# Patient Record
Sex: Male | Born: 1995 | Race: White | Hispanic: No | Marital: Single | Smoking: Never smoker
Health system: Southern US, Community
[De-identification: ages and names within clinical notes are randomized; demographics above are authoritative.]

## PROBLEM LIST (undated history)

## (undated) HISTORY — PX: TYMPANOSTOMY TUBE PLACEMENT: SHX32

---
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
## 2018-02-02 ENCOUNTER — Emergency Department: Payer: Commercial Managed Care - HMO

## 2018-02-02 ENCOUNTER — Emergency Department
Admission: EM | Admit: 2018-02-02 | Discharge: 2018-02-03 | Disposition: A | Discharge status: Home / Self Care | Payer: Commercial Managed Care - HMO | Attending: Emergency Medicine | Admitting: Emergency Medicine

## 2018-02-02 ENCOUNTER — Encounter: Payer: Self-pay | Admitting: Emergency Medicine

## 2018-02-02 ENCOUNTER — Other Ambulatory Visit: Payer: Self-pay

## 2018-02-02 DIAGNOSIS — J069 Acute upper respiratory infection, unspecified: Secondary | ICD-10-CM | POA: Diagnosis not present

## 2018-02-02 DIAGNOSIS — J40 Bronchitis, not specified as acute or chronic: Secondary | ICD-10-CM | POA: Diagnosis not present

## 2018-02-02 DIAGNOSIS — R05 Cough: Secondary | ICD-10-CM | POA: Diagnosis present

## 2018-02-02 LAB — CBC
HEMATOCRIT: 42.9 % (ref 39.0–52.0)
Hemoglobin: 15 g/dL (ref 13.0–17.0)
MCH: 29.3 pg (ref 26.0–34.0)
MCHC: 35 g/dL (ref 30.0–36.0)
MCV: 83.8 fL (ref 80.0–100.0)
Platelets: 191 10*3/uL (ref 150–400)
RBC: 5.12 MIL/uL (ref 4.22–5.81)
RDW: 11.3 % — AB (ref 11.5–15.5)
WBC: 9.2 10*3/uL (ref 4.0–10.5)
nRBC: 0 % (ref 0.0–0.2)

## 2018-02-02 LAB — COMPREHENSIVE METABOLIC PANEL
ALBUMIN: 4.2 g/dL (ref 3.5–5.0)
ALT: 12 U/L (ref 0–44)
ANION GAP: 11 (ref 5–15)
AST: 18 U/L (ref 15–41)
Alkaline Phosphatase: 82 U/L (ref 38–126)
BILIRUBIN TOTAL: 1 mg/dL (ref 0.3–1.2)
BUN: 11 mg/dL (ref 6–20)
CHLORIDE: 102 mmol/L (ref 98–111)
CO2: 27 mmol/L (ref 22–32)
Calcium: 9.2 mg/dL (ref 8.9–10.3)
Creatinine, Ser: 1.06 mg/dL (ref 0.61–1.24)
GFR calc Af Amer: >60 mL/min (ref >60)
Glucose, Bld: 103 mg/dL — ABNORMAL HIGH (ref 70–99)
POTASSIUM: 3.6 mmol/L (ref 3.5–5.1)
Sodium: 140 mmol/L (ref 135–145)
Total Protein: 7.8 g/dL (ref 6.5–8.1)

## 2018-02-02 LAB — GROUP A STREP BY PCR: Group A Strep by PCR: NOT DETECTED

## 2018-02-02 LAB — MONONUCLEOSIS SCREEN: Mono Screen: NEGATIVE

## 2018-02-02 MED ORDER — MENTHOL 3 MG MT LOZG
1.0000 | LOZENGE | OROMUCOSAL | Status: DC | PRN
  Administered 2018-02-03: 3 mg via ORAL
  Filled 2018-02-02: qty 9

## 2018-02-02 NOTE — ED Triage Notes (Signed)
Pt presents to ED with productive cough, congestion, worsening sore throat since Sunday. vomiting Monday X2. Pt came to ED tonight due to "feeling really weird" with blurred vision and tremors after taking otc Mucinex around 1730. Pt alert and talkative and able to answer questions without difficulty. Skin warm and dry.

## 2018-02-03 NOTE — ED Provider Notes (Signed)
[facility] Emergency Department Provider Note   ____________________________________________   First MD Initiated Contact with Patient 02/02/18 2333     (approximate)  I have reviewed the triage vital signs and the nursing notes.   HISTORY  Chief Complaint Sore Throat    HPI Caleb Kennedy is a 22 y.o. male here for evaluation of cough and episode where he felt a little bit "off" a few hours ago   Patient is been having a cough vomited 2 times on Monday, now eating well and no abdominal pain but reports he is continued to have an ongoing occasional dry cough nasal congestion and slightly sore throat.  Reports after taking some cough medication that he was prescribed he started feeling a little bit weird, like his vision was blurred for an hour or so but that is now improved.  No headache.  No neck pain.  No ongoing nausea vomiting.  He is eating well.  History reviewed. No pertinent past medical history.  There are no active problems to display for this patient.   Past Surgical History:  Procedure Laterality Date  . TYMPANOSTOMY TUBE PLACEMENT      Prior to Admission medications   Not on File    Allergies Patient has no known allergies.  No family history on file.  Social History Social History   Tobacco Use  . Smoking status: Never Smoker  . Smokeless tobacco: Never Used  Substance Use Topics  . Alcohol use: Never    Frequency: Never  . Drug use: Never    Review of Systems Constitutional: No fever/chills feeling fatigue at times Eyes: No visual changes. ENT: No sore throat. Cardiovascular: Denies chest pain. Respiratory: See HPI.  No shortness of breath but having frequent dry cough gastrointestinal: No abdominal pain.   Genitourinary: Negative for dysuria. Musculoskeletal: Negative for back pain. Skin: Negative for rash. Neurological: Negative for headaches, areas of focal weakness or  numbness.    ____________________________________________   PHYSICAL EXAM:  VITAL SIGNS: ED Triage Vitals  Enc Vitals Group     BP 02/02/18 2207 (!) 140/95     Pulse Rate 02/02/18 2207 89     Resp 02/02/18 2207 20     Temp 02/02/18 2207 98.2 F (36.8 C)     Temp Source 02/02/18 2207 Oral     SpO2 02/02/18 2207 98 %     Weight 02/02/18 2213 130 lb (59 kg)     Height 02/02/18 2213 5\' 10"  (1.778 m)     Head Circumference --      Peak Flow --      Pain Score 02/02/18 2212 0     Pain Loc --      Pain Edu? --      Excl. in GC? --     Constitutional: Alert and oriented. Well appearing and in no acute distress.  Nontoxic.  Pleasant. Eyes: Conjunctivae are normal. Head: Atraumatic. Nose: No congestion/rhinnorhea. Mouth/Throat: Mucous membranes are moist.  Posterior oropharynx is slightly injected.  Normal tonsils without exudates.  No anterior cervical adenopathy. Neck: No stridor.  Cardiovascular: Normal rate, regular rhythm. Grossly normal heart sounds.  Good peripheral circulation.  Clear speech and language. Respiratory: Normal respiratory effort.  No retractions. Lungs CTAB. Gastrointestinal: Soft and nontender. No distention.  No pain to McBurney's point.  Denies abdominal pain in any quadrant.  No CVA tenderness. Musculoskeletal: No lower extremity tenderness nor edema. Neurologic:  Normal speech and language. No gross focal neurologic deficits  are appreciated.  No ataxia.  Normal smile and motor movements, 5 out of 5 strength all extremities. Skin:  Skin is warm, dry and intact. No rash noted. Psychiatric: Mood and affect are normal. Speech and behavior are normal.  ____________________________________________   LABS (all labs ordered are listed, but only abnormal results are displayed)  Labs Reviewed  CBC - Abnormal; Notable for the following components:      Result Value   RDW 11.3 (*)    All other components within normal limits  COMPREHENSIVE METABOLIC PANEL -  Abnormal; Notable for the following components:   Glucose, Bld 103 (*)    All other components within normal limits  GROUP A STREP BY PCR  MONONUCLEOSIS SCREEN   ____________________________________________  EKG   ____________________________________________  RADIOLOGY  Chest x-ray reviewed negative ____________________________________________   PROCEDURES  Procedure(s) performed: None  Procedures  Critical Care performed: No  ____________________________________________   INITIAL IMPRESSION / ASSESSMENT AND PLAN / ED COURSE  Pertinent labs & imaging results that were available during my care of the patient were reviewed by me and considered in my medical decision making (see chart for details).   Patient returns for evaluation of cough, congestion.  Afebrile nontoxic well-appearing.  Lab work strep and mono negative.  Reassuring labs.  Overall well-appearing, consistent with viral upper respiratory infection, low suspicion for bacterial process.  No signs or symptoms suggest meningitis, pneumonia, intra-abdominal infection, or other concerning etiology at this time.  Patient reports feeling of feeling a little bit weird was shortly after taking Mucinex DM, also Tessalon.  Counseled and advised to not take both together, and consider discontinue Mucinex DM.  Return precautions and treatment recommendations and follow-up discussed with the patient who is agreeable with the plan.       ____________________________________________   FINAL CLINICAL IMPRESSION(S) / ED DIAGNOSES  Final diagnoses:  Viral upper respiratory tract infection  Bronchitis        Note:  This document was prepared using Dragon voice recognition software and may include unintentional dictation errors       Sharyn Creamer, MD 02/03/18 0013

## 2018-06-02 ENCOUNTER — Other Ambulatory Visit: Payer: Self-pay

## 2018-06-02 ENCOUNTER — Emergency Department (HOSPITAL_COMMUNITY)
Admission: EM | Admit: 2018-06-02 | Discharge: 2018-06-03 | Disposition: A | Discharge status: Home / Self Care | Payer: 59 | Attending: Emergency Medicine | Admitting: Emergency Medicine

## 2018-06-02 ENCOUNTER — Emergency Department (HOSPITAL_COMMUNITY): Payer: 59

## 2018-06-02 ENCOUNTER — Encounter (HOSPITAL_COMMUNITY): Payer: Self-pay | Admitting: Emergency Medicine

## 2018-06-02 DIAGNOSIS — R079 Chest pain, unspecified: Secondary | ICD-10-CM | POA: Insufficient documentation

## 2018-06-02 DIAGNOSIS — R0789 Other chest pain: Secondary | ICD-10-CM | POA: Diagnosis present

## 2018-06-02 LAB — CBC
HEMATOCRIT: 44.1 % (ref 39.0–52.0)
HEMOGLOBIN: 14.6 g/dL (ref 13.0–17.0)
MCH: 29.8 pg (ref 26.0–34.0)
MCHC: 33.1 g/dL (ref 30.0–36.0)
MCV: 90 fL (ref 80.0–100.0)
NRBC: 0 % (ref 0.0–0.2)
Platelets: 174 10*3/uL (ref 150–400)
RBC: 4.9 MIL/uL (ref 4.22–5.81)
RDW: 11.8 % (ref 11.5–15.5)
WBC: 6.9 10*3/uL (ref 4.0–10.5)

## 2018-06-02 LAB — BASIC METABOLIC PANEL
ANION GAP: 7 (ref 5–15)
BUN: 11 mg/dL (ref 6–20)
CHLORIDE: 106 mmol/L (ref 98–111)
CO2: 27 mmol/L (ref 22–32)
Calcium: 9.1 mg/dL (ref 8.9–10.3)
Creatinine, Ser: 1.14 mg/dL (ref 0.61–1.24)
Glucose, Bld: 105 mg/dL — ABNORMAL HIGH (ref 70–99)
Potassium: 3.7 mmol/L (ref 3.5–5.1)
SODIUM: 140 mmol/L (ref 135–145)

## 2018-06-02 LAB — POCT I-STAT TROPONIN I: TROPONIN I, POC: 0 ng/mL (ref 0.00–0.08)

## 2018-06-02 MED ORDER — SODIUM CHLORIDE 0.9% FLUSH: 3.0000 mL | Freq: Once | INTRAVENOUS | Status: DC

## 2018-06-02 NOTE — ED Provider Notes (Signed)
ED ECG REPORT   Date: 06/02/2018  Rate: 79  Rhythm: normal sinus rhythm  QRS Axis: right  Intervals: normal  ST/T Wave abnormalities: nonspecific ST changes  Conduction Disutrbances:none  Narrative Interpretation:   Old EKG Reviewed: none available  I have personally reviewed the EKG tracing and agree with the computerized printout as noted.    Vanetta MuldersZackowski, Khoury Siemon, MD 06/02/18 [phone]

## 2018-06-02 NOTE — ED Triage Notes (Signed)
Pt states chest tightness began Thursday, said it came out of no where. No pain when breathing

## 2018-06-03 MED ORDER — RANITIDINE HCL 150 MG PO TABS: 150.0000 mg | ORAL_TABLET | Freq: Two times a day (BID) | ORAL | 0 refills | Status: DC

## 2018-06-03 NOTE — ED Notes (Signed)
PT DISCHARGED. INSTRUCTIONS AND PRESCRIPTION GIVEN. AAOX4. PT IN NO APPARENT DISTRESS OR PAIN. THE OPPORTUNITY TO ASK QUESTIONS WAS PROVIDED. 

## 2018-06-03 NOTE — Discharge Instructions (Signed)
1. Medications: Zantac, usual home medications 2. Treatment: rest, drink plenty of fluids,  3. Follow Up: Please followup with your primary doctor in 2-3 days for discussion of your diagnoses and further evaluation after today's visit; if you do not have a primary care doctor use the resource guide provided to find one; Please return to the ER for worsening pain, development of shortness of breath, pain that radiates to your jaw or arm, vomiting or any other concerns.

## 2018-06-03 NOTE — ED Provider Notes (Signed)
[facility]-EMERGENCY DEPT Provider Note   CSN: [id number] Arrival date & time: 06/02/18  2124     History   Chief Complaint Chief Complaint  Patient presents with  . Chest Pain    HPI Caleb Kennedy is a 23 y.o. male with a hx of no major medical problems presents to the Emergency Department complaining of gradual, persistent, left-sided chest pain onset 2-3 days ago.  Patient reports the pain has been constant without change.  No specific aggravating or alleviating factors.  Patient reports he started working out again approximately 2 weeks ago including lifting weights.  He reports he has been able to walk and exert himself in other ways without worsening of his chest pain over the last several days.  He has not specifically been to the gym in the last several days.  Pain is not worse with movement or coughing.  Patient denies URI symptoms including fever, chills, cough, congestion.  Patient is not a smoker.  No international travel, leg swelling or hormone usage.  He has no history of a DVT.  Pain is not pleuritic.  Patient reports that he has a long history of anxiety.  He reports he does not feel more stressed today than usual.     The history is provided by the patient and medical records. No language interpreter was used.    History reviewed. No pertinent past medical history.  There are no active problems to display for this patient.   Past Surgical History:  Procedure Laterality Date  . TYMPANOSTOMY TUBE PLACEMENT          Home Medications    Prior to Admission medications   Medication Sig Start Date End Date Taking? Authorizing Provider  ranitidine (ZANTAC) 150 MG tablet Take 1 tablet (150 mg total) by mouth 2 (two) times daily. 06/03/18   Jene Oravec, Dahlia Client, PA-C    Family History No family history on file.  Social History Social History   Tobacco Use  . Smoking status: Never Smoker  . Smokeless tobacco: Never Used  Substance Use  Topics  . Alcohol use: Never    Frequency: Never  . Drug use: Never     Allergies   Patient has no known allergies.   Review of Systems Review of Systems  Constitutional: Negative for appetite change, diaphoresis, fatigue, fever and unexpected weight change.  HENT: Negative for mouth sores.   Eyes: Negative for visual disturbance.  Respiratory: Negative for cough, chest tightness, shortness of breath and wheezing.   Cardiovascular: Positive for chest pain.  Gastrointestinal: Negative for abdominal pain, constipation, diarrhea, nausea and vomiting.  Endocrine: Negative for polydipsia, polyphagia and polyuria.  Genitourinary: Negative for dysuria, frequency, hematuria and urgency.  Musculoskeletal: Negative for back pain and neck stiffness.  Skin: Negative for rash.  Allergic/Immunologic: Negative for immunocompromised state.  Neurological: Negative for syncope, light-headedness and headaches.  Hematological: Does not bruise/bleed easily.  Psychiatric/Behavioral: Negative for sleep disturbance. The patient is not nervous/anxious.      Physical Exam Updated Vital Signs BP (!) 148/83 (BP Location: Right Arm)   Pulse 81   Temp 98.5 F (36.9 C) (Oral)   Resp 16   Ht 5\' 10"  (1.778 m)   Wt 63 kg   SpO2 99%   BMI 19.94 kg/m   Physical Exam Vitals signs and nursing note reviewed.  Constitutional:      General: He is not in acute distress.    Appearance: He is well-developed. He is not diaphoretic.  Comments: Awake, alert, nontoxic appearance  HENT:     Head: Normocephalic and atraumatic.     Mouth/Throat:     Pharynx: No oropharyngeal exudate.  Eyes:     General: No scleral icterus.    Conjunctiva/sclera: Conjunctivae normal.  Neck:     Musculoskeletal: Normal range of motion and neck supple.  Cardiovascular:     Rate and Rhythm: Normal rate and regular rhythm.     Heart sounds: Normal heart sounds.  Pulmonary:     Effort: Pulmonary effort is normal. No  respiratory distress.     Breath sounds: Normal breath sounds. No wheezing.  Chest:     Comments: No tenderness to palpation of the left chest. Abdominal:     General: Bowel sounds are normal.     Palpations: Abdomen is soft. There is no mass.     Tenderness: There is no abdominal tenderness. There is no guarding or rebound.  Musculoskeletal: Normal range of motion.  Skin:    General: Skin is warm and dry.  Neurological:     Mental Status: He is alert.     Comments: Speech is clear and goal oriented Moves extremities without ataxia      ED Treatments / Results  Labs (all labs ordered are listed, but only abnormal results are displayed) Labs Reviewed  BASIC METABOLIC PANEL - Abnormal; Notable for the following components:      Result Value   Glucose, Bld 105 (*)    All other components within normal limits  CBC  I-STAT TROPONIN, ED  POCT I-STAT TROPONIN I    ED ECG REPORT   Date: 06/03/2018  Rate: 79  Rhythm: normal sinus rhythm  QRS Axis: right  Intervals: normal  ST/T Wave abnormalities: nonspecific ST changes  Conduction Disutrbances:none  Narrative Interpretation:   Old EKG Reviewed: none available  I have personally reviewed the EKG tracing and agree with the computerized printout as noted.   Radiology Dg Chest 2 View  Result Date: 06/02/2018 CLINICAL DATA:  Chest pain EXAM: CHEST - 2 VIEW COMPARISON:  02/02/2018 FINDINGS: Heart and mediastinal contours are within normal limits. No focal opacities or effusions. No acute bony abnormality. IMPRESSION: No active cardiopulmonary disease. Electronically Signed   By: Charlett NoseKevin  Dover M.D.   On: 06/02/2018 22:34    Procedures Procedures (including critical care time)  Medications Ordered in ED Medications  sodium chloride flush (NS) 0.9 % injection 3 mL (has no administration in time range)     Initial Impression / Assessment and Plan / ED Course  I have reviewed the triage vital signs and the nursing  notes.  Pertinent labs & imaging results that were available during my care of the patient were reviewed by me and considered in my medical decision making (see chart for details).     Patient is to be discharged with recommendation to follow up with PCP in regards to today's hospital visit. Chest pain is not likely of cardiac or pulmonary etiology d/t presentation, PERC negative, VSS, no tracheal deviation, no JVD or new murmur, RRR, breath sounds equal bilaterally, EKG without evidence of STEMI, negative troponin, and negative CXR. Pt has been advised to return to the ED if CP becomes exertional, associated with diaphoresis or nausea, radiates to left jaw/arm, worsens or becomes concerning in any way.  Patient expresses concern for possible acid reflux.  Will give a trial of Zantac.  Pt appears reliable for follow up and is agreeable to discharge.   ECG reviewed  by Dr. Deretha Emory who agrees with the treatment plan.   Final Clinical Impressions(s) / ED Diagnoses   Final diagnoses:  Left-sided chest pain    ED Discharge Orders         Ordered    ranitidine (ZANTAC) 150 MG tablet  2 times daily     06/03/18 0007           Colleena Kurtenbach, Boyd Kerbs 06/03/18 0008    Tilden Fossa, MD 06/03/18 0025

## 2019-01-28 ENCOUNTER — Encounter: Payer: Self-pay | Admitting: Gastroenterology

## 2019-01-28 ENCOUNTER — Ambulatory Visit (INDEPENDENT_AMBULATORY_CARE_PROVIDER_SITE_OTHER): Payer: BC Managed Care – PPO | Admitting: Gastroenterology

## 2019-01-28 ENCOUNTER — Other Ambulatory Visit: Payer: Self-pay

## 2019-01-28 ENCOUNTER — Encounter (INDEPENDENT_AMBULATORY_CARE_PROVIDER_SITE_OTHER): Payer: Self-pay

## 2019-01-28 VITALS — BP 96/65 | HR 82 | Temp 98.1°F | Ht 70.0 in | Wt 136.8 lb

## 2019-01-28 DIAGNOSIS — R110 Nausea: Secondary | ICD-10-CM | POA: Diagnosis not present

## 2019-01-28 DIAGNOSIS — G8929 Other chronic pain: Secondary | ICD-10-CM | POA: Diagnosis not present

## 2019-01-28 DIAGNOSIS — R1013 Epigastric pain: Secondary | ICD-10-CM | POA: Diagnosis not present

## 2019-01-28 MED ORDER — PANTOPRAZOLE SODIUM 40 MG PO TBEC: 40.0000 mg | DELAYED_RELEASE_TABLET | Freq: Every day | ORAL | 0 refills | Status: DC

## 2019-01-28 MED ORDER — DEXILANT 60 MG PO CPDR: 60.0000 mg | DELAYED_RELEASE_CAPSULE | Freq: Every day | ORAL | 0 refills | Status: DC

## 2019-01-28 NOTE — Progress Notes (Signed)
Gastroenterology Consultation  Referring Provider:     Casilda Carls, MD Primary Care Physician:  Casilda Carls, MD Primary Gastroenterologist:  Dr. Allen Norris     Reason for Consultation:     Abdominal pain        HPI:   Caleb Kennedy is a 23 y.o. y/o male referred for consultation & management of abdominal pain by Dr. Casilda Carls, MD.  This patient comes in today with a history of abdominal pain.  The patient has reported the pain to be causing him to not be able to eat at times.  He was started on Nexium by his primary care provider. The patient reports that his abdominal pain is in the epigastric area.  The patient states the pain is off and on and usually better when he eats.  The patient also reports that he has nausea throughout the day but most of his nausea is early in the morning.  The patient was tried on Nexium and states he was taking 20 mg of Nexium in the morning and did not notice any relief with his symptoms.  The patient denies any overt heartburn or regurgitation.  The patient comes today with his mother who is also a patient of mine.  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  The patient's mother states that the patient has not been gaining weight although he states that he has always been skinny and eats well.  There is a report of some softer bowel movements but he only has 1 bowel movement per day.  History reviewed. No pertinent past medical history.  Past Surgical History:  Procedure Laterality Date  . TYMPANOSTOMY TUBE PLACEMENT      Prior to Admission medications   Medication Sig Start Date End Date Taking? Authorizing Provider  ranitidine (ZANTAC) 150 MG tablet Take 1 tablet (150 mg total) by mouth 2 (two) times daily. 06/03/18   Muthersbaugh, Jarrett Soho, PA-C    History reviewed. No pertinent family history.   Social History   Tobacco Use  . Smoking status: Never Smoker  . Smokeless tobacco: Never Used   Substance Use Topics  . Alcohol use: Never    Frequency: Never  . Drug use: Never    Allergies as of 01/28/2019  . (No Known Allergies)    Review of Systems:    All systems reviewed and negative except where noted in HPI.   Physical Exam:  BP 96/65   Pulse 82   Temp 98.1 F (36.7 C) (Temporal)   Ht 5\' 10"  (1.778 m)   Wt 136 lb 12.8 oz (62.1 kg)   BMI 19.63 kg/m  No LMP for male patient. General:   Alert,  Well-developed, well-nourished, pleasant and cooperative in NAD Head:  Normocephalic and atraumatic. Eyes:  Sclera clear, no icterus.   Conjunctiva pink. Ears:  Normal auditory acuity. Nose:  No deformity, discharge, or lesions. Mouth:  No deformity or lesions,oropharynx pink & moist. Neck:  Supple; no masses or thyromegaly. Lungs:  Respirations even and unlabored.  Clear throughout to auscultation.   No wheezes, crackles, or rhonchi. No acute distress. Heart:  Regular rate and rhythm; no murmurs, clicks, rubs, or gallops. Abdomen:  Normal bowel sounds.  No bruits.  Soft, non-tender and non-distended without masses, hepatosplenomegaly.  There was a small ventral hernia noted.  No guarding or rebound tenderness.  Negative Carnett sign.   Rectal:  Deferred.  Msk:  Symmetrical without gross deformities.  Good, equal movement & strength bilaterally. Pulses:  Normal pulses noted. Extremities:  No clubbing or edema.  No cyanosis. Neurologic:  Alert and oriented x3;  grossly normal neurologically. Skin:  Intact without significant lesions or rashes.  No jaundice. Lymph Nodes:  No significant cervical adenopathy. Psych:  Alert and cooperative. Normal mood and affect.  Imaging Studies: No results found.  Assessment and Plan:   Caleb Kennedy is a 23 y.o. y/o male who comes in today with a longstanding history of nausea mostly in the morning.  The patient has been told the pathophysiology of nausea and that since it happens in the morning more than any other times a day it may  be nocturnal reflux.  The patient will stop his Nexium and start Protonix right before he goes to bed so that he will have his maximum acid suppression while laying down.  If this does not help his symptoms the patient has been told that he should then be set up for a upper endoscopy.  The patient or his mother will contact us if he needs to be set up for an upper endoscopy.  The patient and his mother have been explained the plan and agree with it.  Midge Minium, MD. Clementeen Graham    Note: This dictation was prepared with Dragon dictation along with smaller phrase technology. Any transcriptional errors that result from this process are unintentional.

## 2019-03-07 ENCOUNTER — Other Ambulatory Visit: Payer: Self-pay

## 2019-03-07 ENCOUNTER — Other Ambulatory Visit: Payer: Self-pay | Admitting: Gastroenterology

## 2019-03-07 MED ORDER — PANTOPRAZOLE SODIUM 40 MG PO TBEC: 40.0000 mg | DELAYED_RELEASE_TABLET | Freq: Every day | ORAL | 1 refills | Status: DC

## 2019-03-07 NOTE — Telephone Encounter (Signed)
*  STAT* If patient is at the pharmacy, call can be transferred to refill team.   1. Which medications need to be refilled? (please list name of each medication and dose if known) pantoprazole (PROTONIX) 40 MG tablet  2. Which pharmacy/location (including street and city if local pharmacy) is medication to be sent to? [place]  3. Do they need a 30 day or 90 day supply? 90  day

## 2019-03-07 NOTE — Telephone Encounter (Signed)
Pt's mother notified Pantoprazole has been sent to pt's pharmacy.

## 2019-11-29 IMAGING — CR DG CHEST 2V
2 series · 2 of 2 positions shown · non-contrast
Comparison: None.

CLINICAL DATA: Productive cough and congestion with sore throat
since [REDACTED].

EXAM:
CHEST - 2 VIEW

[chest pa]
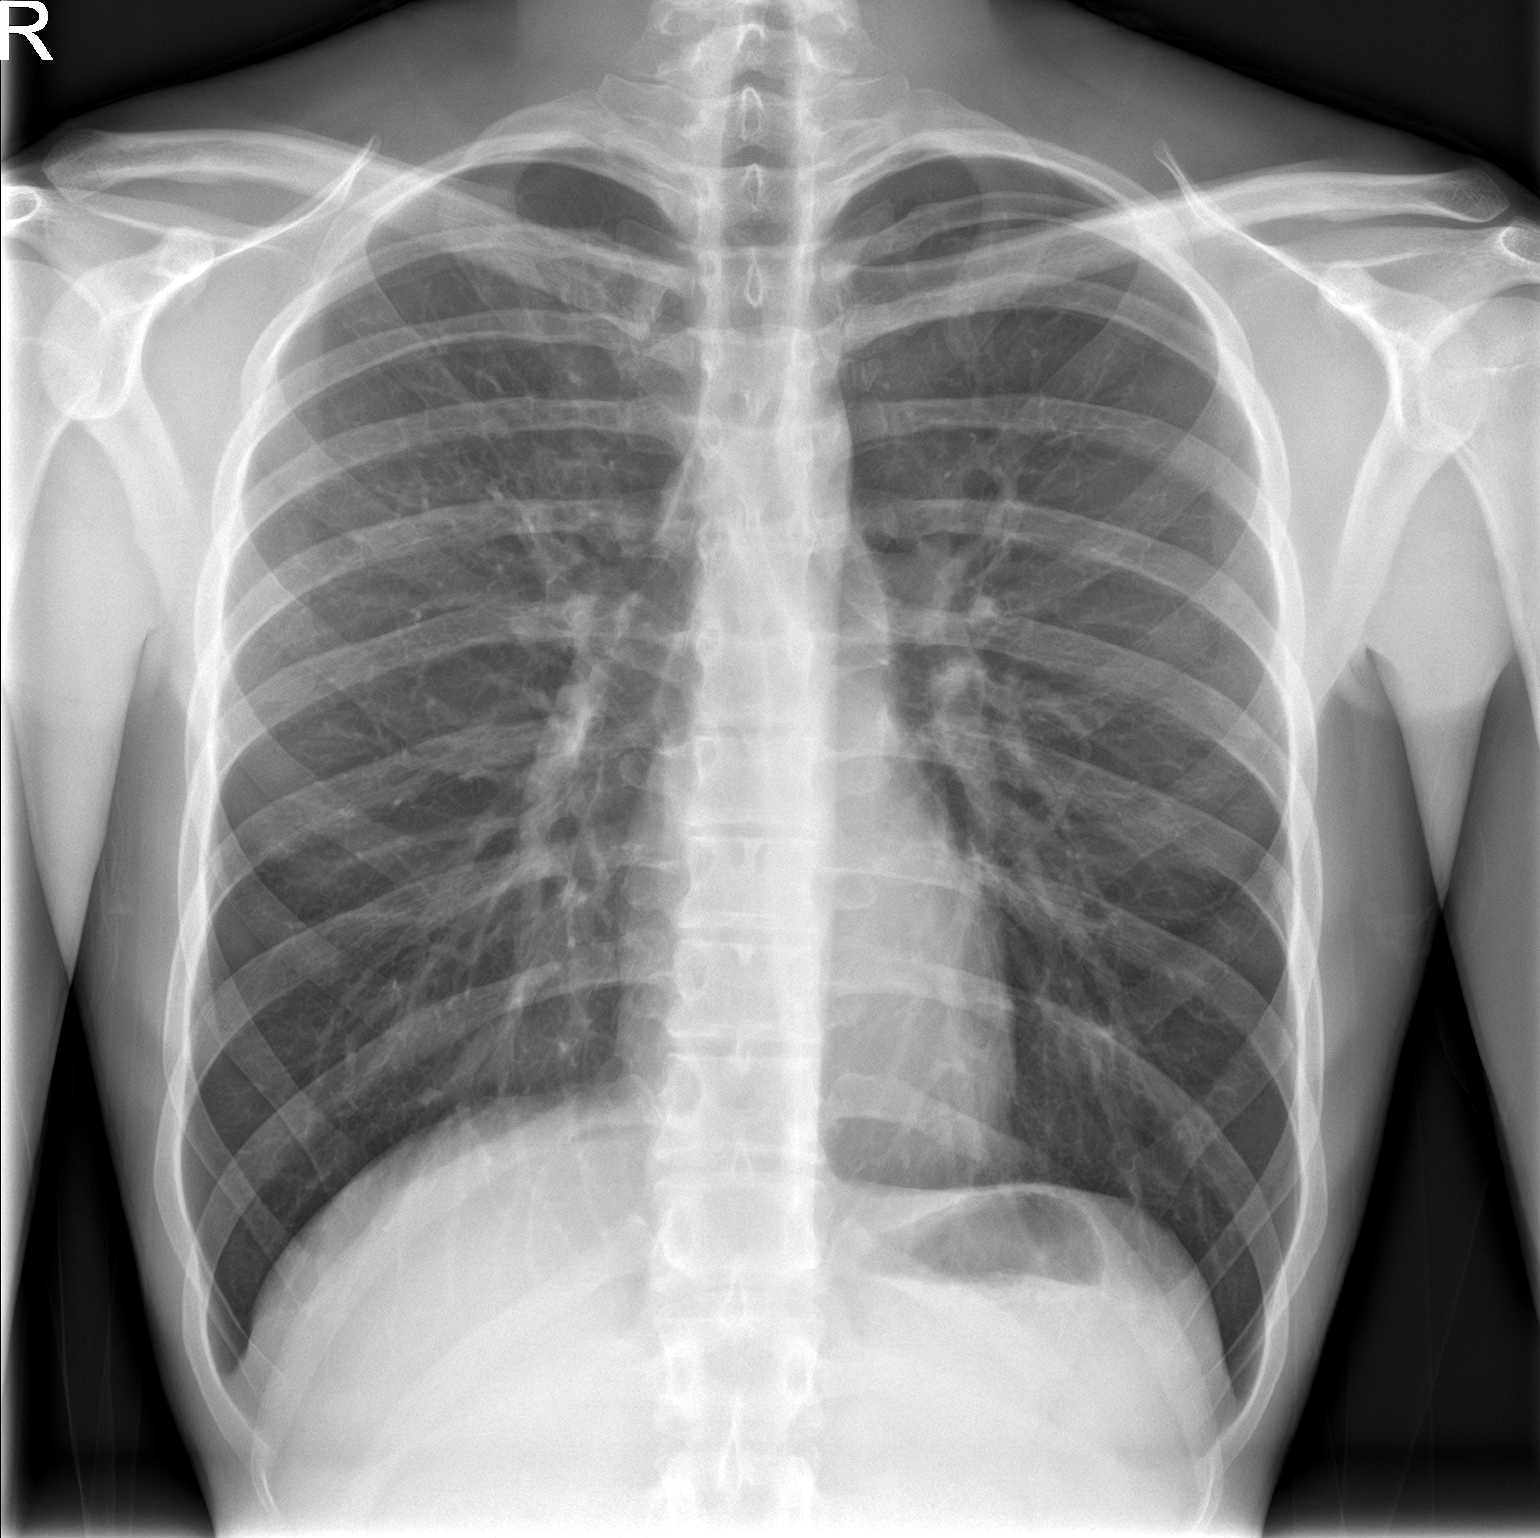

[chest lat]
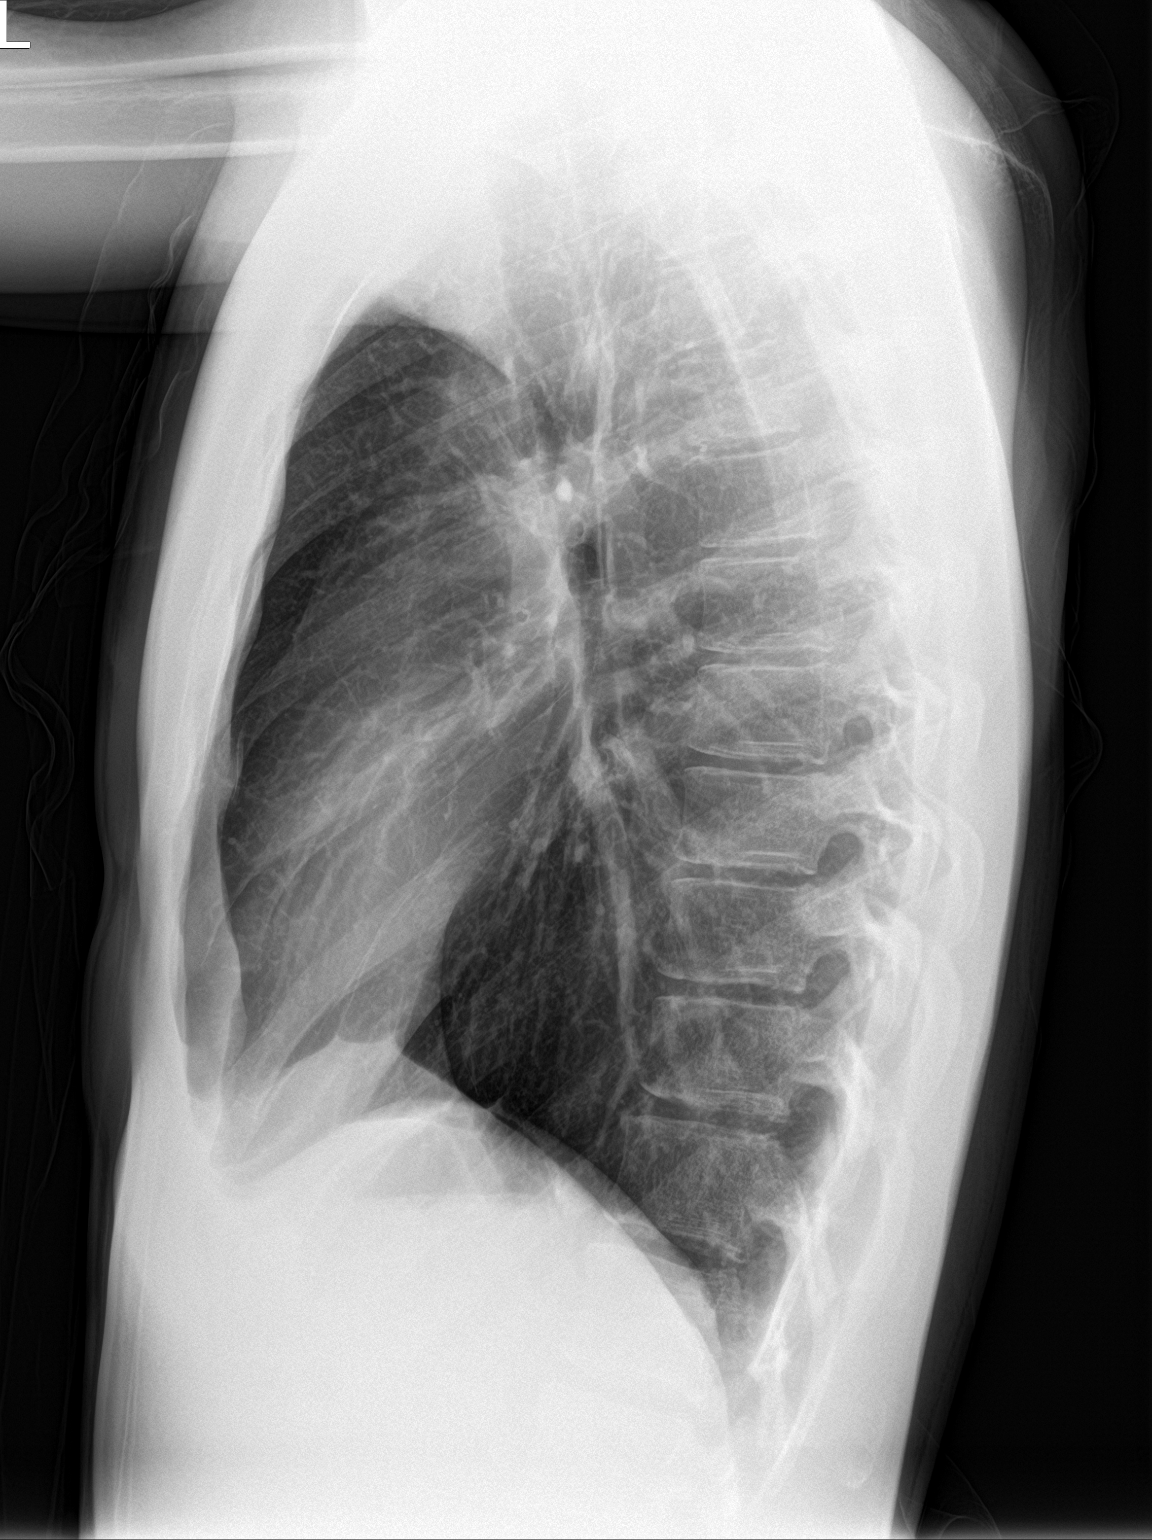

[2 of 2 positions shown; findings below may reference images not displayed]

FINDINGS: The heart size and mediastinal contours are within normal limits.
Both lungs are clear. The visualized skeletal structures are
unremarkable.
IMPRESSION: No active cardiopulmonary disease.

## 2020-04-27 ENCOUNTER — Ambulatory Visit
Admission: EM | Admit: 2020-04-27 | Discharge: 2020-04-27 | Disposition: A | Discharge status: Home / Self Care | Payer: BC Managed Care – PPO | Attending: Family Medicine | Admitting: Family Medicine

## 2020-04-27 DIAGNOSIS — U071 COVID-19: Secondary | ICD-10-CM | POA: Insufficient documentation

## 2020-04-27 LAB — POCT RAPID STREP A (OFFICE): Rapid Strep A Screen: NEGATIVE

## 2020-04-27 MED ORDER — PREDNISONE 20 MG PO TABS: 20.0000 mg | ORAL_TABLET | Freq: Every day | ORAL | 0 refills | Status: DC

## 2020-04-27 MED ORDER — LIDOCAINE VISCOUS HCL 2 % MT SOLN: 10.0000 mL | OROMUCOSAL | 0 refills | Status: DC | PRN

## 2020-04-27 NOTE — ED Triage Notes (Signed)
+  home covid test yesterday. Reports having sore throat x3-4 days. Painful when swallowing.

## 2020-04-27 NOTE — Discharge Instructions (Addendum)
Your COVID 19 results will be available in 3-5 days. Negative results are immediately resulted to Mychart. Positive results will receive a follow-up call from our clinic. If symptoms are present, I recommend home quarantine until results are known.   Take medication as prescribed. Rapid strep negative. Throat culture pending.

## 2020-04-27 NOTE — ED Provider Notes (Signed)
[facility]    CSN: [id number] Arrival date & time: 04/27/20  1450      History   Chief Complaint Chief Complaint  Patient presents with  . Sore Throat    HPI Caleb Kennedy is a 25 y.o. male.   HPI  Patient here today for confirmatory COVID -19 test after having two home test with one resulting as positive x 1 day.  Concern for possible strep, requesting a rapid strep test.  Afebrile.   No past medical history on file.  There are no problems to display for this patient.   Past Surgical History:  Procedure Laterality Date  . TYMPANOSTOMY TUBE PLACEMENT         Home Medications    Prior to Admission medications   Medication Sig Start Date End Date Taking? Authorizing Provider  dexlansoprazole (DEXILANT) 60 MG capsule Take 1 capsule (60 mg total) by mouth daily. 01/28/19   Midge Minium, MD  pantoprazole (PROTONIX) 40 MG tablet Take 1 tablet (40 mg total) by mouth daily. 03/07/19   Midge Minium, MD    Family History No family history on file.  Social History Social History   Tobacco Use  . Smoking status: Never Smoker  . Smokeless tobacco: Never Used  Vaping Use  . Vaping Use: Never used  Substance Use Topics  . Alcohol use: Never  . Drug use: Never     Allergies   Patient has no known allergies.   Review of Systems Review of Systems Pertinent negatives listed in HPI   Physical Exam Triage Vital Signs ED Triage Vitals [04/27/20 1530]  Enc Vitals Group     BP 128/86     Pulse Rate (!) 112     Resp 16     Temp 98 F (36.7 C)     Temp src      SpO2 98 %     Weight      Height      Head Circumference      Peak Flow      Pain Score      Pain Loc      Pain Edu?      Excl. in GC?    No data found.  Updated Vital Signs BP 128/86   Pulse (!) 112   Temp 98 F (36.7 C)   Resp 16   SpO2 98%   Visual Acuity Right Eye Distance:   Left Eye Distance:   Bilateral Distance:    Right Eye Near:   Left Eye Near:    Bilateral  Near:     Physical Exam  General Appearance:    Alert, cooperative, ill-appearing, no distress  HENT:   Normocephalic, ears normal, nares mucosal edema with congestion, rhinorrhea, oropharynx erythematous with tonsillar swelling   Eyes:    PERRL, conjunctiva/corneas clear, EOM's intact       Lungs:     Clear to auscultation bilaterally, respirations unlabored  Heart:    Regular rate and rhythm  Neurologic:   Awake, alert, oriented x 3. No apparent focal neurological           defect.      UC Treatments / Results  Labs (all labs ordered are listed, but only abnormal results are displayed) Labs Reviewed - No data to display  EKG   Radiology No results found.  Procedures Procedures (including critical care time)  Medications Ordered in UC Medications - No data to display  Initial Impression / Assessment and  Plan / UC Course  I have reviewed the triage vital signs and the nursing notes.  Pertinent labs & imaging results that were available during my care of the patient were reviewed by me and considered in my medical decision making (see chart for details).    Rapid strep negative. Covid home test positive, patient advised of positive COVID infection. PCR pending to meet school requirements. Throat culture pending. Treatment per discharge medications. Red flag precautions given. Final Clinical Impressions(s) / UC Diagnoses   Final diagnoses:  None   Discharge Instructions   None    ED Prescriptions    None     PDMP not reviewed this encounter.   Bing Neighbors, [place] 04/27/20 2108

## 2020-04-29 LAB — CULTURE, GROUP A STREP (THRC)

## 2020-04-30 LAB — COVID-19, FLU A+B NAA
Influenza A, NAA: NOT DETECTED
Influenza B, NAA: NOT DETECTED
SARS-CoV-2, NAA: DETECTED — AB

## 2020-04-30 LAB — CULTURE, GROUP A STREP (THRC)

## 2021-06-22 ENCOUNTER — Encounter: Payer: Self-pay | Admitting: Emergency Medicine

## 2021-06-22 ENCOUNTER — Other Ambulatory Visit: Payer: Self-pay

## 2021-06-22 ENCOUNTER — Emergency Department
Admission: EM | Admit: 2021-06-22 | Discharge: 2021-06-22 | Disposition: A | Discharge status: Home / Self Care | Payer: BC Managed Care – PPO | Attending: Emergency Medicine | Admitting: Emergency Medicine

## 2021-06-22 ENCOUNTER — Emergency Department: Payer: BC Managed Care – PPO

## 2021-06-22 DIAGNOSIS — F419 Anxiety disorder, unspecified: Secondary | ICD-10-CM | POA: Diagnosis not present

## 2021-06-22 DIAGNOSIS — R1310 Dysphagia, unspecified: Secondary | ICD-10-CM | POA: Insufficient documentation

## 2021-06-22 DIAGNOSIS — R002 Palpitations: Secondary | ICD-10-CM | POA: Diagnosis not present

## 2021-06-22 DIAGNOSIS — R0789 Other chest pain: Secondary | ICD-10-CM | POA: Insufficient documentation

## 2021-06-22 DIAGNOSIS — R110 Nausea: Secondary | ICD-10-CM | POA: Insufficient documentation

## 2021-06-22 DIAGNOSIS — R000 Tachycardia, unspecified: Secondary | ICD-10-CM | POA: Insufficient documentation

## 2021-06-22 LAB — CBC
HCT: 42.3 % (ref 39.0–52.0)
Hemoglobin: 14.7 g/dL (ref 13.0–17.0)
MCH: 29.4 pg (ref 26.0–34.0)
MCHC: 34.8 g/dL (ref 30.0–36.0)
MCV: 84.6 fL (ref 80.0–100.0)
Platelets: 198 10*3/uL (ref 150–400)
RBC: 5 MIL/uL (ref 4.22–5.81)
RDW: 11.9 % (ref 11.5–15.5)
WBC: 7.1 10*3/uL (ref 4.0–10.5)
nRBC: 0 % (ref 0.0–0.2)

## 2021-06-22 LAB — BASIC METABOLIC PANEL
Anion gap: 10 (ref 5–15)
BUN: 13 mg/dL (ref 6–20)
CO2: 27 mmol/L (ref 22–32)
Calcium: 9.5 mg/dL (ref 8.9–10.3)
Chloride: 104 mmol/L (ref 98–111)
Creatinine, Ser: 1.16 mg/dL (ref 0.61–1.24)
GFR, Estimated: >60 mL/min (ref >60)
Glucose, Bld: 85 mg/dL (ref 70–99)
Potassium: 3.2 mmol/L — ABNORMAL LOW (ref 3.5–5.1)
Sodium: 141 mmol/L (ref 135–145)

## 2021-06-22 LAB — TROPONIN I (HIGH SENSITIVITY)
Troponin I (High Sensitivity): <2 ng/L (ref <18)
Troponin I (High Sensitivity): <2 ng/L (ref <18)

## 2021-06-22 MED ORDER — ONDANSETRON HCL 8 MG PO TABS: 8.0000 mg | ORAL_TABLET | Freq: Three times a day (TID) | ORAL | 0 refills | Status: DC | PRN

## 2021-06-22 MED ORDER — PANTOPRAZOLE SODIUM 40 MG PO TBEC: 40.0000 mg | DELAYED_RELEASE_TABLET | Freq: Every day | ORAL | 0 refills | Status: DC

## 2021-06-22 NOTE — Discharge Instructions (Addendum)
The tests today were reassuring, with a normal EKG, negative cardiac enzymes, and normal blood chemistry.  Take the Protonix as prescribed.  You may take the Zofran as needed for nausea.  Follow-up with your primary care physician.  We have also provided referral to a cardiologist; if you continue to have intermittent palpitations you will need to follow-up with cardiology for possible Holter monitoring or other outpatient work-up.  If the nausea and difficulty with eating continue you also will likely need gastroenterology follow-up.  You can discuss this with your primary care doctor.  Return to the ER immediately for new, worsening, or persistent severe palpitations, chest pain, difficulty breathing, nausea or vomiting, inability to swallow, or any other new or worsening symptoms that concern you.

## 2021-06-22 NOTE — ED Provider Notes (Signed)
[facility] Provider Note    Event Date/Time   First MD Initiated Contact with Patient 06/22/21 2100     (approximate)   History   Palpitations   HPI  Caleb Kennedy is a 26 y.o. male with no active medical problems who presents with intermittent palpitations over the last few months which he describes as a flutter sensation and which is usually quite brief, generally lasting a few seconds.  Sometimes he then starts to become anxious, starts to have tightness in his chest and shortness of breath, and this may last slightly longer.  The patient also reports intermittent nausea especially after eating, and sometimes he feels like he has difficulty swallowing.  This is also been going on for some time.  Initially the fluttering sensation was associated with this.  However, now the nausea has improved somewhat but he is still having the fluttering.  He denies any chest pain or pressure.  He has no leg swelling.  He denies vomiting or diarrhea.     Physical Exam   Triage Vital Signs: ED Triage Vitals  Enc Vitals Group     BP 06/22/21 1843 121/65     Pulse Rate 06/22/21 1843 74     Resp 06/22/21 1843 17     Temp 06/22/21 1843 97.8 F (36.6 C)     Temp Source 06/22/21 1843 Oral     SpO2 06/22/21 1843 100 %     Weight 06/22/21 1836 133 lb (60.3 kg)     Height 06/22/21 1836 6' (1.829 m)     Head Circumference --      Peak Flow --      Pain Score 06/22/21 1835 0     Pain Loc --      Pain Edu? --      Excl. in GC? --     Most recent vital signs: Vitals:   06/22/21 2049 06/22/21 2305  BP: 136/73 (!) 105/59  Pulse: 78 70  Resp: 18 16  Temp:  97.8 F (36.6 C)  SpO2: 100% 98%    General: Awake, no distress.  CV:  Good peripheral perfusion.  Normal heart sounds. Resp:  Normal effort.  Lungs CTAB. Abd:  No distention.  Other:  Motor and sensory intact in all extremities.  Normal coordination.  No ataxia.   ED Results / Procedures / Treatments    Labs (all labs ordered are listed, but only abnormal results are displayed) Labs Reviewed  BASIC METABOLIC PANEL - Abnormal; Notable for the following components:      Result Value   Potassium 3.2 (*)    All other components within normal limits  CBC  TROPONIN I (HIGH SENSITIVITY)  TROPONIN I (HIGH SENSITIVITY)     EKG  ED ECG REPORT I, Dionne Bucy, the attending physician, personally viewed and interpreted this ECG.  Date: 06/23/2021 EKG Time: 1838 Rate: 88 Rhythm: normal sinus rhythm QRS Axis: normal Intervals: normal ST/T Wave abnormalities: Nonspecific T wave abnormalities laterally Narrative Interpretation: Nonspecific abnormalities with no evidence of acute ischemia    RADIOLOGY  Chest x-ray: I independently viewed and interpreted the images; there is no focal consolidation or edema  PROCEDURES:  Critical Care performed: No  Procedures   MEDICATIONS ORDERED IN ED: Medications - No data to display   IMPRESSION / MDM / ASSESSMENT AND PLAN / ED COURSE  I reviewed the triage vital signs and the nursing notes.  26 year old male with no active medical problems presents  with an acute on chronic episode of very brief palpitations which are often associated with anxiety and what sounds like a prevent elation.  He also has some chronic episodes of nausea with eating, but this is not active today.  On exam the patient is well-appearing.  His vital signs are normal.  The physical exam is otherwise unremarkable.  Chest x-ray shows no acute abnormalities.  EKG  is nonischemic and does not show evidence of arrhythmia.  Basic labs and initial troponin are normal.  Overall presentation is consistent with PVCs, sinus tachycardia, anxiety, or other benign etiology.  The patient has some nausea and occasionally reports difficulty eating but this does not seem to be true dysphagia from his description of it.  This may also be anxiety mediated.  Differential also  includes GERD.  We will obtain a second troponin, however based on the negative work-up and well appearance, there is no indication for further ED work-up or monitoring.  I recommend that the patient's start on omeprazole and have also prescribe Zofran if needed for the nausea, as this seems to often be triggering the palpitations.  I have provided cardiology referral as the patient likely would benefit from outpatient Holter monitoring.  He may also need GI follow-up if the symptoms do not resolve on their own.  I counseled the patient and mother extensively on the results of the work-up, likely etiologies of his symptoms, and plan of care.  I gave them thorough return precautions and they expressed understanding.   FINAL CLINICAL IMPRESSION(S) / ED DIAGNOSES   Final diagnoses:  Palpitations     Rx / DC Orders   ED Discharge Orders          Ordered    pantoprazole (PROTONIX) 40 MG tablet  Daily        06/22/21 2301    ondansetron (ZOFRAN) 8 MG tablet  Every 8 hours PRN        06/22/21 2301             Note:  This document was prepared using Dragon voice recognition software and may include unintentional dictation errors.    Dionne Bucy, MD 06/23/21 [phone]

## 2021-06-22 NOTE — ED Triage Notes (Signed)
Pt via POV from home. Pt c/o chest palpitations and SOB. Pt states that it has been going on for a month but got worse in the past couple of day. Also endorses nausea. Pt is A&OX4 and NAD.

## 2021-07-30 ENCOUNTER — Ambulatory Visit: Payer: BC Managed Care – PPO | Admitting: Cardiology

## 2022-04-07 ENCOUNTER — Ambulatory Visit
Admission: EM | Admit: 2022-04-07 | Discharge: 2022-04-07 | Disposition: A | Discharge status: Home / Self Care | Payer: BC Managed Care – PPO | Attending: Urgent Care | Admitting: Urgent Care

## 2022-04-07 DIAGNOSIS — R509 Fever, unspecified: Secondary | ICD-10-CM

## 2022-04-07 DIAGNOSIS — R058 Other specified cough: Secondary | ICD-10-CM | POA: Insufficient documentation

## 2022-04-07 DIAGNOSIS — Z1152 Encounter for screening for COVID-19: Secondary | ICD-10-CM | POA: Insufficient documentation

## 2022-04-07 DIAGNOSIS — R6889 Other general symptoms and signs: Secondary | ICD-10-CM | POA: Insufficient documentation

## 2022-04-07 MED ORDER — BENZONATATE 100 MG PO CAPS: ORAL_CAPSULE | ORAL | 0 refills | Status: DC

## 2022-04-07 NOTE — Discharge Instructions (Signed)
You have been diagnosed with a viral upper respiratory infection based on your symptoms and exam. Viral illnesses cannot be treated with antibiotics - they are self limiting - and you should find your symptoms resolving within a few days. Get plenty of rest and non-caffeinated fluids.  We have performed a respiratory swab checking for COVID, and influenza.   We recommend you use over-the-counter medications for symptom control including Tylenol or ibuprofen for fever, chills or body aches, and  cold/cough medication.  Saline mist spray is helpful for removing excess mucus from your nose.  Room humidifiers are helpful to ease breathing at night. You might also find relief of nasal/sinus congestion symptoms by using a nasal decongestant such as Sudafed sinus (pseudoephedrine).  You will need to obtain this medication from behind the pharmacist counter.  Speak to the pharmacist to verify that you are not duplicating medications with other over-the-counter formulations that you may be using.   Follow up here or with your primary care provider if your symptoms are worsening or not improving.

## 2022-04-07 NOTE — ED Provider Notes (Signed)
Renal[facility] F[facility]ler    CSN: [id number] Arrival date & time: 04/07/22  1800      History   Chief Complaint Chief Complaint  Patient presents with   Fever   Cough    HPI TAGGART PRASAD is a 26 y.o. male.    Fever Associated symptoms: cough   Cough Associated symptoms: fever     Patient presents to urgent care with fever (100.3 F) starting 3 days ago.  He endorses headache, body aches, nausea, diarrhea, fatigue, decreased appetite.  He has dizziness that he believes is related to medication use.  He states his lungs "stinging" when he coughs.  History reviewed. No pertinent past medical history.  There are no problems to display for this patient.   Past Surgical History:  Procedure Laterality Date   TYMPANOSTOMY TUBE PLACEMENT         Home Medications    Prior to Admission medications   Medication Sig Start Date End Date Taking? Authorizing Provider  omeprazole (PRILOSEC) 20 MG capsule Take 20 mg by mouth daily.   Yes [provider]  dexlansoprazole (DEXILANT) 60 MG capsule Take 1 capsule (60 mg total) by mouth daily. 01/28/19   Midge Minium, MD  lidocaine (XYLOCAINE) 2 % solution Use as directed 10 mLs in the mouth or throat every 3 (three) hours as needed for mouth pain (mix with water and gargle and spit). 04/27/20   Bing Neighbors, FNP  ondansetron (ZOFRAN) 8 MG tablet Take 1 tablet (8 mg total) by mouth every 8 (eight) hours as needed for nausea or vomiting. 06/22/21   Dionne Bucy, MD  pantoprazole (PROTONIX) 40 MG tablet Take 1 tablet (40 mg total) by mouth daily. 06/22/21 07/22/21  Dionne Bucy, MD  predniSONE (DELTASONE) 20 MG tablet Take 1 tablet (20 mg total) by mouth daily with breakfast. 04/27/20   Bing Neighbors, FNP    Family History History reviewed. No pertinent family history.  Social History Social History   Tobacco Use   Smoking status: Never   Smokeless tobacco: Never  Vaping Use   Vaping Use: Never used   Substance Use Topics   Alcohol use: Never   Drug use: Never     Allergies   Patient has no known allergies.   Review of Systems Review of Systems  Constitutional:  Positive for fever.  Respiratory:  Positive for cough.     Physical Exam Triage Vital Signs ED Triage Vitals  Enc Vitals Group     BP 04/07/22 1849 119/81     Pulse Rate 04/07/22 1849 76     Resp 04/07/22 1849 18     Temp 04/07/22 1849 100 F (37.8 C)     Temp Source 04/07/22 1849 Oral     SpO2 04/07/22 1849 96 %     Weight --      Height --      Head Circumference --      Peak Flow --      Pain Score 04/07/22 1853 0     Pain Loc --      Pain Edu? --      Excl. in GC? --    No data found.  Updated Vital Signs BP 119/81 (BP Location: Left Arm)   Pulse 76   Temp 100 F (37.8 C) (Oral)   Resp 18   SpO2 96%   Visual Acuity Right Eye Distance:   Left Eye Distance:   Bilateral Distance:    Right Eye  Near:   Left Eye Near:    Bilateral Near:     Physical Exam Vitals reviewed.  Constitutional:      Appearance: Normal appearance.  Cardiovascular:     Rate and Rhythm: Normal rate and regular rhythm.     Pulses: Normal pulses.     Heart sounds: Normal heart sounds.  Pulmonary:     Effort: Pulmonary effort is normal.     Breath sounds: Normal breath sounds.  Skin:    General: Skin is warm and dry.  Neurological:     General: No focal deficit present.     Mental Status: He is alert and oriented to person, place, and time.  Psychiatric:        Mood and Affect: Mood normal.        Behavior: Behavior normal.      UC Treatments / Results  Labs (all labs ordered are listed, but only abnormal results are displayed) Labs Reviewed - No data to display  EKG   Radiology No results found.  Procedures Procedures (including critical care time)  Medications Ordered in UC Medications - No data to display  Initial Impression / Assessment and Plan / UC Course  I have reviewed the triage  vital signs and the nursing notes.  Pertinent labs & imaging results that were available during my care of the patient were reviewed by me and considered in my medical decision making (see chart for details).   Patient is febrile here without recent antipyretics. Satting well on room air. Overall is ill appearing, well hydrated, without respiratory distress. Pulmonary exam is unremarkable.   Viral process is suspected, including flu.  Results of respiratory swab are pending but will not change treatment plan.  Recommending use of OTC medication for symptom control.  Benzonatate is ordered for treatment of cough.  Final Clinical Impressions(s) / UC Diagnoses   Final diagnoses:  None   Discharge Instructions   None    ED Prescriptions   None    PDMP not reviewed this encounter.   Charma Igo, [place] 04/07/22 1929

## 2022-04-07 NOTE — ED Triage Notes (Signed)
Pt reports fever (100.3) starting 3 days ago.  Developed HA, body aches, nausea, diarrhea, fatigue, decreased appetite.  Dizziness that he believes is r/t meds as it occurs after he takes meds. Lungs sting when he coughs. No sore throat.   Last Tylenol was 1400.

## 2022-04-08 LAB — RESP PANEL BY RT-PCR (FLU A&B, COVID) ARPGX2
Influenza A by PCR: POSITIVE — AB
Influenza B by PCR: NEGATIVE
SARS Coronavirus 2 by RT PCR: NEGATIVE

## 2022-12-06 ENCOUNTER — Other Ambulatory Visit: Payer: Self-pay

## 2023-01-24 ENCOUNTER — Encounter: Payer: Self-pay | Admitting: Gastroenterology

## 2023-01-24 ENCOUNTER — Ambulatory Visit (INDEPENDENT_AMBULATORY_CARE_PROVIDER_SITE_OTHER): Payer: BC Managed Care – PPO | Admitting: Gastroenterology

## 2023-01-24 VITALS — BP 121/82 | HR 70 | Temp 98.6°F | Ht 72.0 in | Wt 138.0 lb

## 2023-01-24 DIAGNOSIS — R0789 Other chest pain: Secondary | ICD-10-CM | POA: Diagnosis not present

## 2023-01-24 DIAGNOSIS — R12 Heartburn: Secondary | ICD-10-CM | POA: Diagnosis not present

## 2023-01-24 DIAGNOSIS — K219 Gastro-esophageal reflux disease without esophagitis: Secondary | ICD-10-CM

## 2023-01-24 NOTE — Progress Notes (Signed)
Gastroenterology Consultation  Referring Provider:     Sherrie Mustache, MD Primary Care Physician:  Sherrie Mustache, MD Primary Gastroenterologist:  Dr. Servando Snare     Reason for Consultation:     Chest pain        HPI:   Caleb Kennedy is a 27 y.o. y/o male referred for consultation & management of chest pain by Dr. Sherrie Mustache, MD. This patient comes in today after seeing me back in 2020 for morning nausea.  The patient has been to his primary care provider recently with a report of chest pain and a chest x-ray was done and was normal.  The patient's labs that was scanned in from July were all normal. The patient reports that his chest pain is reproducible by touching the breastbone.  He states that is made worse by exercising such as when he goes swimming.  The patient also states that when he stretches and feels a popping noise in his sternum the pain is relieved for some time.  There is no report of dysphagia.  He does report that he has heartburn on a regular occasion and usually more than 3 times a week.  He states it is very related to the food he eats but the chest pain is not related to the food or to the heartburn.  No past medical history on file.  Past Surgical History:  Procedure Laterality Date   TYMPANOSTOMY TUBE PLACEMENT      Prior to Admission medications   Medication Sig Start Date End Date Taking? Authorizing Provider  benzonatate (TESSALON) 100 MG capsule Take 1-2 tablets 3 times a day as needed for cough 04/07/22   Immordino, Jeannett Senior, FNP  dexlansoprazole (DEXILANT) 60 MG capsule Take 1 capsule (60 mg total) by mouth daily. 01/28/19   Midge Minium, MD  lidocaine (XYLOCAINE) 2 % solution Use as directed 10 mLs in the mouth or throat every 3 (three) hours as needed for mouth pain (mix with water and gargle and spit). 04/27/20   Bing Neighbors, NP  omeprazole (PRILOSEC) 20 MG capsule Take 20 mg by mouth daily.    [provider]  ondansetron (ZOFRAN) 8 MG tablet  Take 1 tablet (8 mg total) by mouth every 8 (eight) hours as needed for nausea or vomiting. 06/22/21   Dionne Bucy, MD  pantoprazole (PROTONIX) 40 MG tablet Take 1 tablet (40 mg total) by mouth daily. 06/22/21 07/22/21  Dionne Bucy, MD  predniSONE (DELTASONE) 20 MG tablet Take 1 tablet (20 mg total) by mouth daily with breakfast. 04/27/20   Bing Neighbors, NP    No family history on file.   Social History   Tobacco Use   Smoking status: Never   Smokeless tobacco: Never  Vaping Use   Vaping status: Never Used  Substance Use Topics   Alcohol use: Never   Drug use: Never    Allergies as of 01/24/2023   (No Known Allergies)    Review of Systems:    All systems reviewed and negative except where noted in HPI.   Physical Exam:  BP 121/82 (BP Location: Left Arm, Patient Position: Sitting, Cuff Size: Normal)   Pulse 70   Temp 98.6 F (37 C) (Oral)   Ht 6' (1.829 m)   Wt 138 lb (62.6 kg)   BMI 18.72 kg/m  No LMP for male patient. General:   Alert,  Well-developed, well-nourished, pleasant and cooperative in NAD Head:  Normocephalic and atraumatic. Eyes:  Sclera clear,  no icterus.   Conjunctiva pink. Ears:  Normal auditory acuity. Neck:  Supple; no masses or thyromegaly. Lungs:  Respirations even and unlabored.  Clear throughout to auscultation.   No wheezes, crackles, or rhonchi. No acute distress. Heart:  Regular rate and rhythm; no murmurs, clicks, rubs, or gallops. Abdomen:  Normal bowel sounds.  No bruits.  Soft, non-tender and non-distended without masses, hepatosplenomegaly or hernias noted.  No guarding or rebound tenderness.  Negative Carnett sign.   Rectal:  Deferred.  Pulses:  Normal pulses noted. Extremities:  No clubbing or edema.  No cyanosis. Neurologic:  Alert and oriented x3;  grossly normal neurologically. Skin:  Intact without significant lesions or rashes.  No jaundice. Lymph Nodes:  No significant cervical adenopathy. Psych:  Alert and  cooperative. Normal mood and affect.  Imaging Studies: No results found.  Assessment and Plan:   Caleb Kennedy is a 27 y.o. y/o male who comes in today with signs and symptoms consistent with musculoskeletal pain with the most likely diagnosis of costochondritis with reproducing of the pain by palpation and with movement including exercise.  The patient does have longstanding heartburn which is not associated with the pain and has been told to start the Nexium that he has.  The patient will take NSAIDs for a week to see if this helps his costochondritis.  He has been told to stop the NSAIDs if he starts to have any GI symptoms.  The patient has been explained the plan and agrees with it.    Midge Minium, MD. Clementeen Graham    Note: This dictation was prepared with Dragon dictation along with smaller phrase technology. Any transcriptional errors that result from this process are unintentional.
